# Patient Record
Sex: Female | Born: 1970 | Race: White | Hispanic: No | Marital: Married | Smoking: Never smoker
Health system: Southern US, Community
[De-identification: ages and names within clinical notes are randomized; demographics above are authoritative.]

---

## 2013-01-12 ENCOUNTER — Ambulatory Visit (INDEPENDENT_AMBULATORY_CARE_PROVIDER_SITE_OTHER): Payer: BC Managed Care – PPO | Admitting: Sports Medicine

## 2013-01-12 ENCOUNTER — Encounter: Payer: Self-pay | Admitting: Sports Medicine

## 2013-01-12 ENCOUNTER — Ambulatory Visit
Admission: RE | Admit: 2013-01-12 | Discharge: 2013-01-12 | Disposition: A | Discharge status: Home / Self Care | Payer: BC Managed Care – PPO | Source: Ambulatory Visit | Attending: Sports Medicine | Admitting: Sports Medicine

## 2013-01-12 VITALS — BP 135/85 | HR 99 | Ht 67.0 in | Wt 170.0 lb

## 2013-01-12 DIAGNOSIS — M533 Sacrococcygeal disorders, not elsewhere classified: Secondary | ICD-10-CM

## 2013-01-12 DIAGNOSIS — G8929 Other chronic pain: Secondary | ICD-10-CM

## 2013-01-12 DIAGNOSIS — M549 Dorsalgia, unspecified: Secondary | ICD-10-CM

## 2013-01-13 ENCOUNTER — Encounter: Payer: Self-pay | Admitting: *Deleted

## 2013-01-13 NOTE — Progress Notes (Signed)
  Subjective:    Patient ID: Sophia Cole, female    DOB: 15-Oct-1970, 42 y.o.   MRN: [id number]  HPI chief complaint: Low back pain  Very pleasant 42 year old female comes in today complaining of chronic low back pain. Her pain began after the delivery of her last child. She had a complicated hospitalization after she suffered from pyelonephritis during that delivery. In fact, she went into septic shock, was in the hospital a total of 8 days and experienced congestive heart failure as well. She was discharged home with physical therapy due to bilateral lower extremity weakness from her prolonged hospitalization. Since that time, she has experienced chronic intermittent right-sided low back pain. She has tried diligently to be reactive and in fact started doing some running but her pain has prevented her from being as active as she would like. She localizes all of her pain to the right SI joint. It is worse first thing in the morning as well as with activity. He has a very difficult time doing a sit up. She denies any groin pain. Pain at times will radiate across the low back but she denies any associated numbness or tingling into her legs. She's had several chiropractic adjustments and each adjustment does help but only temporarily. No recent trauma. No prior low back surgeries. No recent x-rays.  Medical history is as above Medications are reviewed She is allergic to Compazine Socially she does not smoke, she drinks on a rare occasion, and is a stay-at-home mom. She is a former ER nurse    Review of Systems     Objective:   Physical Exam Well-developed, well-nourished. No acute distress. Awake alert and oriented x3. Vital signs are reviewed.  Lumbar spine: She has pain with forward flexion as well as with extension. There is tenderness to palpation directly over the right SI joint but a negative Fabers. No tenderness over the greater trochanteric bursa and smooth painless hip range of motion.  Hip abductor strength is fairly good. She is unable to perform a simple set up do to pain. She has a negative straight leg raise bilaterally. Her strength is 5/5 in both lower extremities. Reflexes are equal at the Achilles and patellar tendons bilaterally. Sensation is intact to light touch grossly. No noticeable atrophy. Examination of her running form shows pronation, left greater than right with genu valgum.       Assessment & Plan:  1. 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 I will get AP and lateral views. I will call her with those results once available. If unremarkable, he will start physical therapy with Ellamae Sia. I've given her some green sports insoles with scaphoid pads to help correct her pronation and she will followup with me in 4 weeks. I explained to her that this will be a lengthy process to get her better. She ultimately will need aggressive core strengthening once her pain is under better control.  Total time spent face-to-face with the patient in instruction and consultation was 30 minutes.

## 2013-01-17 ENCOUNTER — Telehealth: Payer: Self-pay | Admitting: Sports Medicine

## 2013-01-17 NOTE — Telephone Encounter (Signed)
I spoke with the patient on the phone regarding x-rays of her lumbar spine. Minimal degenerative changes but overall rather unremarkable. She is scheduled to meet with Ellamae Sia soon. She is having some knee pain secondary to her orthotics. I recommended that she removed the scaphoid pads but continue with the green sports insoles. I will reevaluate this at her followup visit.

## 2013-02-13 ENCOUNTER — Encounter: Payer: Self-pay | Admitting: Sports Medicine

## 2013-02-13 ENCOUNTER — Ambulatory Visit (INDEPENDENT_AMBULATORY_CARE_PROVIDER_SITE_OTHER): Payer: BC Managed Care – PPO | Admitting: Sports Medicine

## 2013-02-13 VITALS — BP 124/86 | HR 64 | Ht 67.0 in | Wt 164.0 lb

## 2013-02-13 DIAGNOSIS — M549 Dorsalgia, unspecified: Secondary | ICD-10-CM

## 2013-02-13 NOTE — Progress Notes (Signed)
  Subjective:    Patient ID: Sophia Cole, female    DOB: 1970/09/27, 42 y.o.   MRN: [id number]  HPI Sophia Cole comes in today for followup on chronic low back pain. She has started working with Ellamae Sia and feels like she is about 15% improved. She is encouraged by her progress so far. After our last phone conversation she decided to keep her scaphoid pads in her shoes. She states they are comfortable. She is experiencing intermittent spasming of her back but continues to deny radiating pain into her legs or her feet. No associated numbness or tingling. X-rays of her lumbar spine showed some mild degenerative changes at L5-S1. Otherwise, unremarkable.    Review of Systems     Objective:   Physical Exam Well-developed, well-nourished. No acute distress. Awake alert and oriented x3  Lumbar spine: Good forward flexion and extension. No appreciable spasm. Negative straight leg raise bilaterally. No gross focal neurological deficits of either lower extremity.       Assessment & Plan:  1. Low back pain secondary to chronic lumbar strain with core weakness 2. Pronation  Patient will continue with physical therapy. Followup with me in 4 weeks at which point we will consider making her custom orthotics. If symptoms do not continue to improve with more physical therapy then we may need to consider merits of further diagnostic imaging. She apparently stumbled across some sort of "band procedure" on the Internet for chronic low back pain. I've asked her to provide me with more information and I'll be happy to research this for her but I explained to her that surgical options for low back pain should be approached cautiously.

## 2013-03-15 ENCOUNTER — Ambulatory Visit (INDEPENDENT_AMBULATORY_CARE_PROVIDER_SITE_OTHER): Payer: BC Managed Care – PPO | Admitting: Sports Medicine

## 2013-03-15 VITALS — BP 111/71 | Ht 67.0 in | Wt 164.0 lb

## 2013-03-15 DIAGNOSIS — M549 Dorsalgia, unspecified: Secondary | ICD-10-CM

## 2013-03-15 DIAGNOSIS — G8929 Other chronic pain: Secondary | ICD-10-CM

## 2013-03-15 NOTE — Progress Notes (Signed)
  Subjective:    Patient ID: Sophia Cole, female    DOB: [birth year], 42 y.o.   MRN: [id number]  HPI Sophia Cole comes in today for followup. She is making slow but steady progress in physical therapy. I have Sophia Cole's notes for review and he would like to continue to see her for a few more weeks. She would like to go ahead with custom orthotics as we have discussed previously. She remains active doing cross fit 3 times a week and would like to start doing some running.    Review of Systems     Objective:   Physical Exam Well-developed, well-nourished. No acute distress  Excellent lumbar range of motion. No spasm. No appreciable trigger points. No neurological deficits of either lower extremity grossly. Walking without a limp.       Assessment & Plan:  1. Low back pain secondary to chronic lumbar strain with core weakness 2. Pronation  Patient will continue working with Sophia Cole where she is making good progress. We will go ahead and make her some custom orthotics today and I will see her back in 4 weeks. No running yet.  Patient was fitted for a : standard, cushioned, semi-rigid orthotic. The orthotic was heated and afterward the patient stood on the orthotic blank positioned on the orthotic stand. The patient was positioned in subtalar neutral position and 10 degrees of ankle dorsiflexion in a weight bearing stance. After completion of molding, a stable base was applied to the orthotic blank. The blank was ground to a stable position for weight bearing. Size: 7 Base:blue EVB Posting: None Additional orthotic padding: none

## 2013-04-19 ENCOUNTER — Ambulatory Visit (INDEPENDENT_AMBULATORY_CARE_PROVIDER_SITE_OTHER): Payer: BC Managed Care – PPO | Admitting: Sports Medicine

## 2013-04-19 VITALS — BP 122/81 | Ht 67.0 in | Wt 162.0 lb

## 2013-04-19 DIAGNOSIS — M546 Pain in thoracic spine: Secondary | ICD-10-CM

## 2013-04-19 DIAGNOSIS — M533 Sacrococcygeal disorders, not elsewhere classified: Secondary | ICD-10-CM

## 2013-04-19 DIAGNOSIS — M545 Low back pain: Secondary | ICD-10-CM

## 2013-04-19 DIAGNOSIS — M549 Dorsalgia, unspecified: Secondary | ICD-10-CM

## 2013-04-19 NOTE — Progress Notes (Signed)
CC: Followup back pain HPI: Patient is a very pleasant 42 year old female who presents for followup of back pain. We previously been seeing her for low back pain and SI joint pain. She has been working with Sharen Hones in physical therapy and feels that her low back pain is now 85-90% better. She can definitely noticed that her pelvis is more steady. However she feels that her pain is now traveling up her back and she has more pain in the periscapular area on the right side. She occasionally gets neck pain and tension headaches as well. She says that she still has some weakness and this is causing her pain according to physical therapy. She is continuing to do cross it 3 times a week as well as occasional cardio classes and walking. She did try to run one time for a few seconds but started to feel some tightness in her low back and so stopped. She is wearing her orthotics that we made for her and thinks that they are comfortable and helpful.  ROS: As above in the HPI. All other systems are stable or negative.  OBJECTIVE: APPEARANCE:  Patient in no acute distress.The patient appeared well nourished and normally developed. HEENT: No scleral icterus. Conjunctiva non-injected Resp: Non labored Skin: No rash MSK:  Back exam: - Full range of motion in flexion, extension, lateral bending, rotation without pain  - No tenderness to palpation over the spinous processes of the lumbar vertebra - Tenderness to palpation at the right periscapular muscles and the trapezius and rhomboids - No tenderness to palpation at the SI joint or sciatic notch - Normal scapulohumeral rhythm. No scapular winging  ASSESSMENT: Biomechanical low back pain and periscapular pain, improving with conservative therapy   PLAN: Patient was encouraged to continue in physical therapy and doing her home exercises. She is making slow but steady progress which is great to see. We have encouraged her to continue her activities but  to avoid running at this point. We will see her back in 4 weeks for reevaluation.

## 2013-04-19 NOTE — Patient Instructions (Signed)
Thank you for coming in today  Continue your therapy with Sophia Cole We are glad you like your orthotics Continue to avoid running Consider massage therapy  We will see you again in 1 month

## 2013-05-22 ENCOUNTER — Ambulatory Visit (INDEPENDENT_AMBULATORY_CARE_PROVIDER_SITE_OTHER): Payer: BC Managed Care – PPO | Admitting: Sports Medicine

## 2013-05-22 VITALS — BP 128/80 | Ht 67.0 in | Wt 162.0 lb

## 2013-05-22 DIAGNOSIS — M549 Dorsalgia, unspecified: Secondary | ICD-10-CM

## 2013-05-22 NOTE — Assessment & Plan Note (Signed)
Assessment: patient with improving core strength, but still having hindering intermittent low back pain it is likely tightening of lumbar paraspinal muscles with possible contribution from tight hamstrings on the right greater than the left Plan:  - Discontinue physical therapy at this time for trial of home exercises - Patient given instructions for stretching will focus on hamstring flexibility - Stop planking - Run and walk as tolerated - Followup in one month

## 2013-05-22 NOTE — Progress Notes (Signed)
  Subjective:    Patient ID: Allanah Mcfarland, female    DOB: 1970/09/21, 42 y.o.   MRN: [id number]  HPI  42 year old female who presents for followup of low back pain that is considered to be biomechanical in origin secondary to core weakness. Her interventions for chronic low back pain have included physical therapy from  Ellamae Sia and the use of semirigid orthotics. Previous visit in August, she was encouraged to continue physical therapy and home exercises. She also instructed to avoid running. Since that time, the patient notes worsening pain since her last visit. She notes a "downward spiral" since starting planking exercises as instructed by her physical therapist. She specifies that the planking has caused her back to start "locking up." This is sudden low back pain when she changes position from lying to seated or standing. It last few a for second and then resolves. She has continued cross fit three times a week and does not have much locking up while doing cross it. She is not running. 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 She does not have any more PT appointment scheduled, which is causing her a lot of anxiety.    Review of Systems Denies weakness, bowel incontinence, urinary retention, and numbness of perineum     Objective:   Physical Exam BP 128/80  Ht 5\' 7"  (1.702 m)  Wt 162 lb (73.483 kg)  BMI 25.37 kg/m2  General: middle-aged white female, non-ill-appearing, pleasant and conversant  Back:  Appearance: sciolosis no Palpation: tenderness of paraspinal muscles yes, spinous process no; pelvis no  Flexion: normal Extension: normal FADIR: negative FABER: negative  Legs:  Length equal - 96 cm bilaterally Right hamstring with 10 less flexion than left  Neuro: Strength hip flexion 5/5, hip abduction 5/5, hip adduction 5/5,  knee extension 5/5, knee flexion 5/5, dorsiflexion 5/5, plantar  flexion 5/5 Straight Leg Raise: negative Sensation to light touch: intact Reflexes: 2+ Achilles and patellar bilaterally     Assessment & Plan:

## 2013-06-21 ENCOUNTER — Ambulatory Visit (INDEPENDENT_AMBULATORY_CARE_PROVIDER_SITE_OTHER): Payer: BC Managed Care – PPO | Admitting: Sports Medicine

## 2013-06-21 VITALS — BP 110/70 | Ht 67.0 in | Wt 162.0 lb

## 2013-06-21 DIAGNOSIS — M549 Dorsalgia, unspecified: Secondary | ICD-10-CM

## 2013-06-21 NOTE — Progress Notes (Signed)
  Subjective:    Patient ID: Sophia Cole, female    DOB: 01/20/1971, 42 y.o.   MRN: [id number]  HPI Sophia Cole comes in today for followup on her low back pain. Overall, she is feeling pretty good. She has avoided planking and so she has not had any more episodes of her back "locking up". She has been discharged from physical therapy and has an excellent understanding of her home exercise program. She has been working on hamstring flexibility. She is without complaint today.    Review of Systems     Objective:   Physical Exam Well-developed, well-nourished. No acute distress. Awake alert and oriented x3  Lumbar spine: Full painless lumbar mobility. She has fairly good flexibility with hamstring stretching today. No tenderness to palpation along the paraspinal musculature. Good hip abductor strength. Good hip flexor strength. Neurovascularly intact distally.       Assessment & Plan:  Improved low back pain secondary to core weakness  It has taken several months for the patient to progress to the point that she is at now. She seems to be satisfied with her progress to date. She understands that there are some activities that she will be able to do and others that she should avoid. She understands the importance of a lifelong core strengthening program. She may from time to time need a return visit to physical therapy for a "tuneup" but for now she is doing excellent. I think at this point in time we can keep things open-ended for her to followup as needed.

## 2014-07-14 IMAGING — CR DG LUMBAR SPINE 2-3V
3 series · 3 of 3 positions shown · non-contrast
Comparison: None.

CLINICAL DATA: Low back pain for 5 years

LUMBAR SPINE - 2-3 VIEW

[t l-spine a.p.]
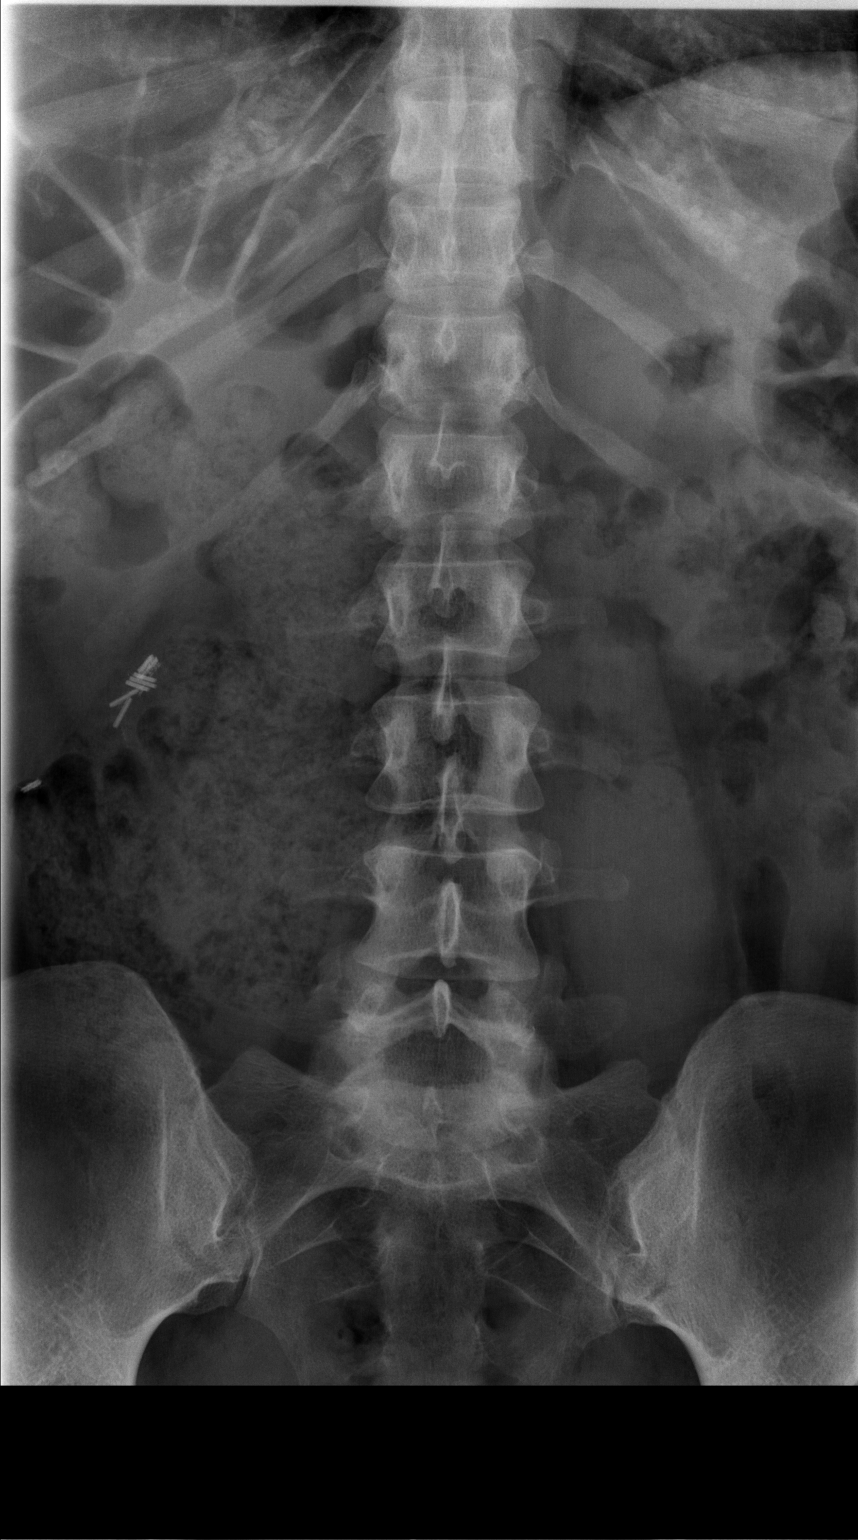

[t l-spine lat]
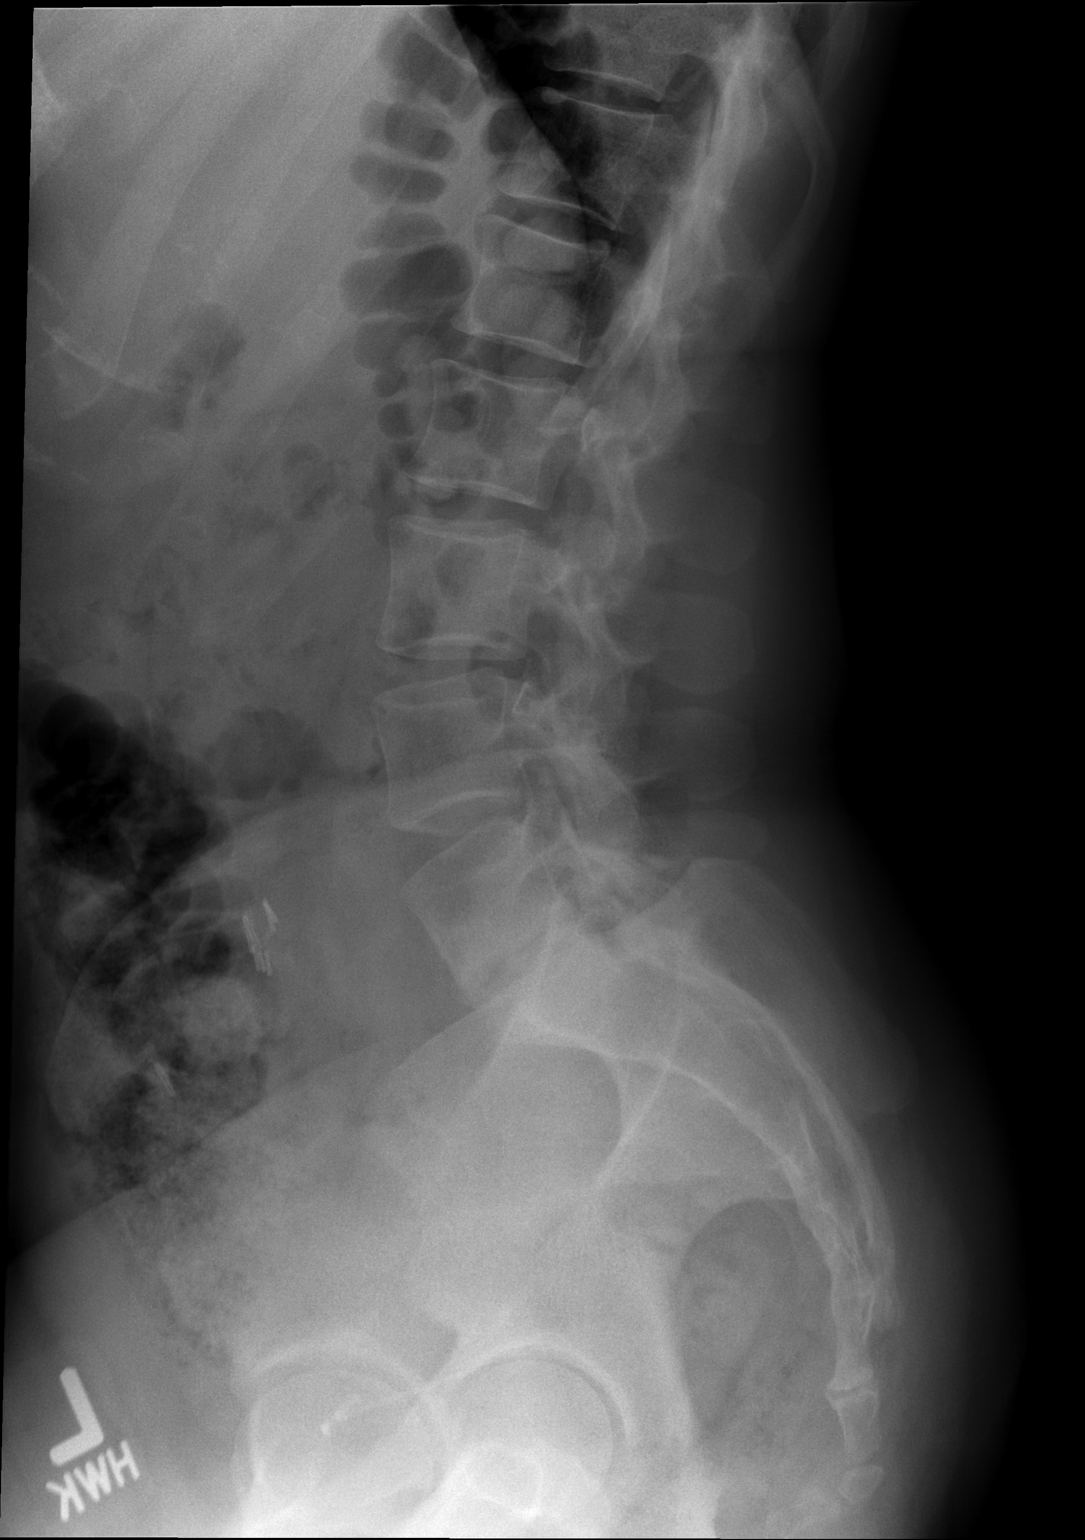

[t l-spine l5-s1 spot]
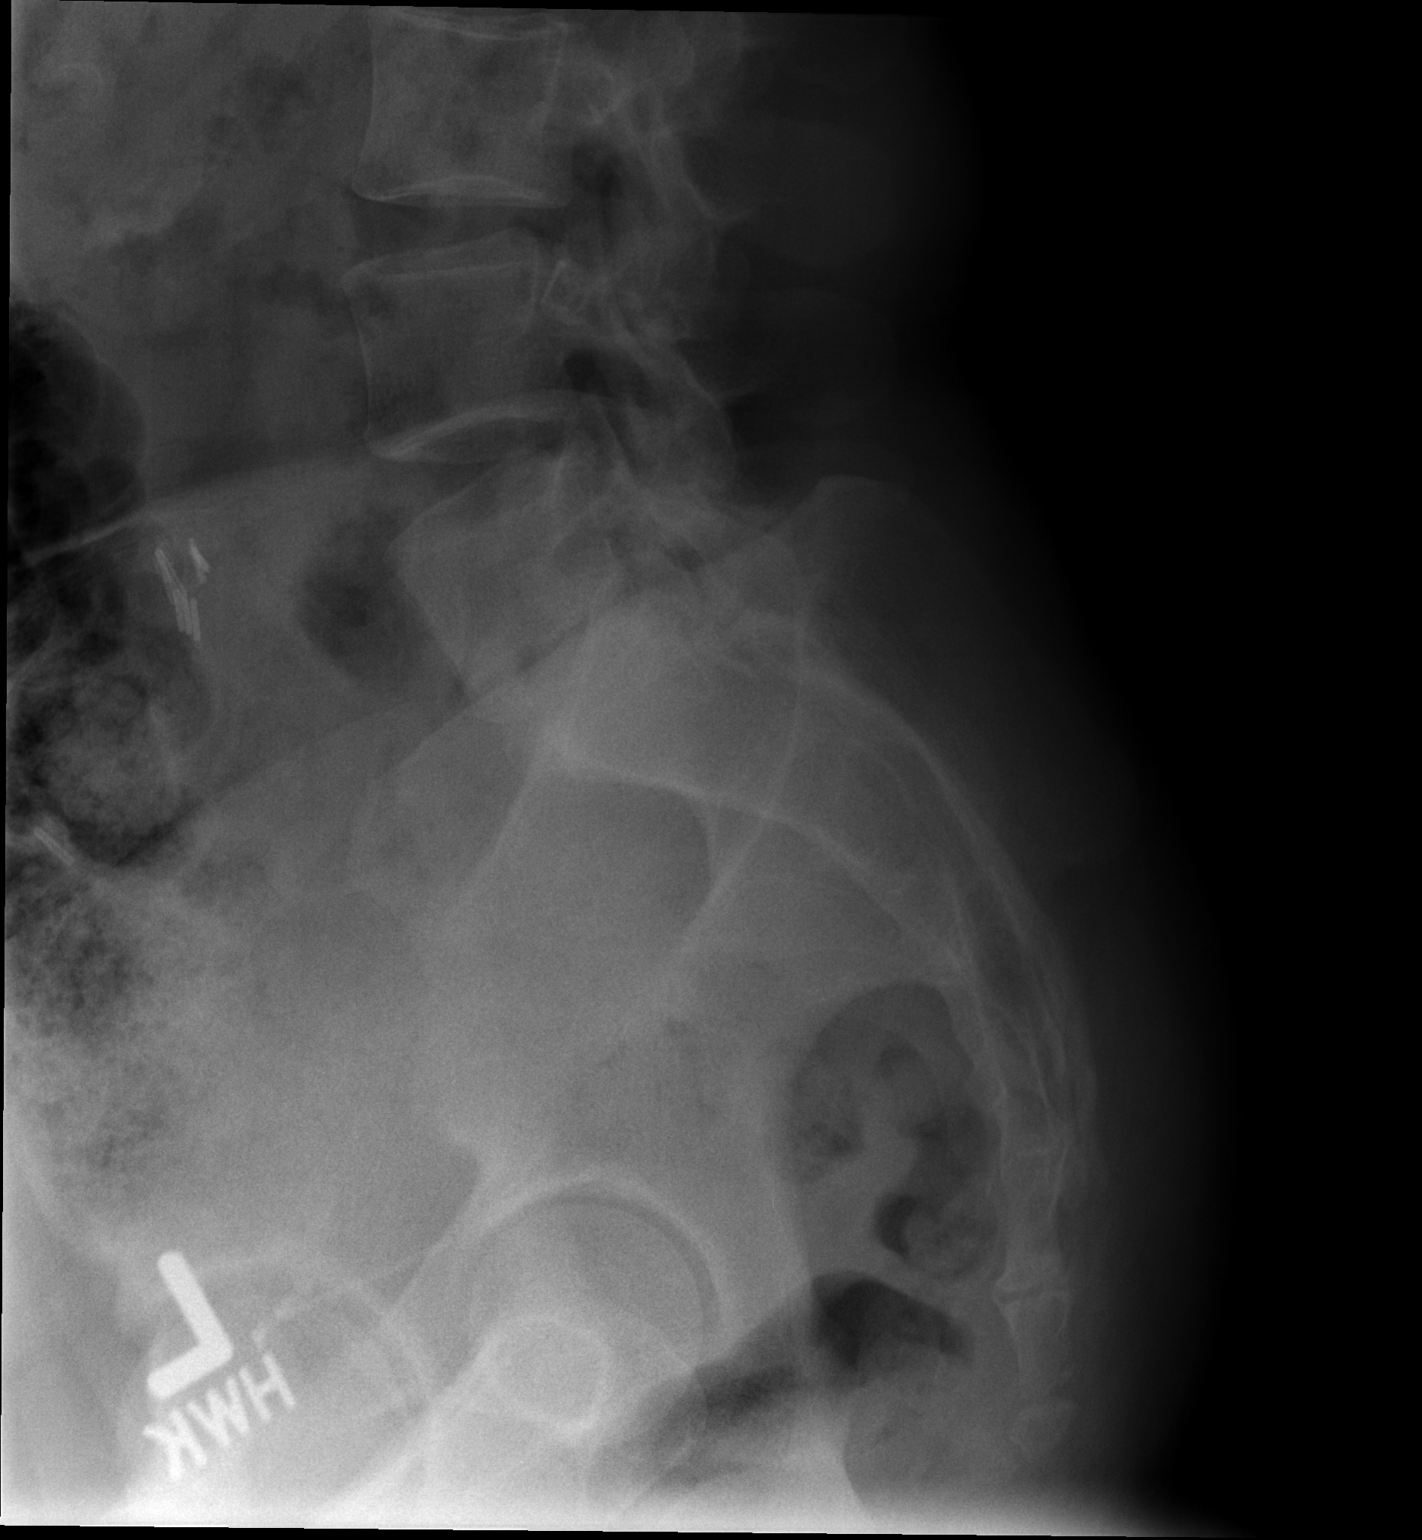

[3 of 3 positions shown; findings below may reference images not displayed]

FINDINGS: The lumbar vertebrae are in normal alignment.  Only mild
degenerative disc disease is present at L5-S1 with some loss of
disc space and mild sclerosis.  No compression deformity is seen.
The bowel gas pattern is nonspecific.  Surgical clips are present
in the right upper quadrant from prior cholecystectomy.
IMPRESSION: Normal alignment.  Mild degenerative disc disease at L5-S1.

## 2020-04-30 ENCOUNTER — Other Ambulatory Visit: Payer: Self-pay

## 2020-04-30 ENCOUNTER — Encounter: Payer: Self-pay | Admitting: Sports Medicine

## 2020-04-30 ENCOUNTER — Ambulatory Visit (INDEPENDENT_AMBULATORY_CARE_PROVIDER_SITE_OTHER): Payer: BC Managed Care – PPO | Admitting: Sports Medicine

## 2020-04-30 VITALS — BP 124/70 | Ht 67.0 in | Wt 187.6 lb

## 2020-04-30 DIAGNOSIS — M722 Plantar fascial fibromatosis: Secondary | ICD-10-CM | POA: Diagnosis not present

## 2020-04-30 NOTE — Progress Notes (Signed)
   Subjective:    Patient ID: Sophia Cole, female    DOB: July 30, 1971, 49 y.o.   MRN: [id number]  HPI chief complaint: Left heel pain  Sophia Cole comes in today complaining of several weeks of left heel pain.  Pain is along the plantar aspect of her heel.  Worse with first step.  She has tried an arch strap and some stretching at home.  She has had custom orthotics in the past with good results.  Those orthotics are quite old.  She has purchased a new pair of tennis shoes.  She is here today for advice about treating her plantar fasciitis.  Past medical history reviewed Medications reviewed Allergies reviewed    Review of Systems    As above Objective:   Physical Exam  Well-developed, well-nourished.  No acute distress  Examination of both feet shows a rigid cavus foot.  She is tender to palpation at the calcaneal origin of the plantar fascia the left.  Negative calcaneal squeeze.  No soft tissue swelling.  Good pulses.      Assessment & Plan:   Plantar fasciitis, left foot Cavus foot  I think the patient would benefit from new custom orthotics.  She will schedule that at her earliest convenience.  In the meantime, we will educate her in plantar fascial stretching and she will purchase a [place] sock to wear at night.

## 2020-05-07 ENCOUNTER — Other Ambulatory Visit: Payer: Self-pay

## 2020-05-07 ENCOUNTER — Encounter: Payer: Self-pay | Admitting: Sports Medicine

## 2020-05-07 ENCOUNTER — Ambulatory Visit (INDEPENDENT_AMBULATORY_CARE_PROVIDER_SITE_OTHER): Payer: BC Managed Care – PPO | Admitting: Sports Medicine

## 2020-05-07 VITALS — BP 128/80 | Ht 67.0 in | Wt 186.0 lb

## 2020-05-07 DIAGNOSIS — M722 Plantar fascial fibromatosis: Secondary | ICD-10-CM | POA: Diagnosis not present

## 2020-05-07 NOTE — Progress Notes (Signed)
Patient ID: Sophia Cole, female   DOB: 1970/12/27, 49 y.o.   MRN: [id number]  Leasia comes in today for custom orthotics.  Please see the office note from April 30, 2020 for details regarding history and physical exam findings.  In short, she is battling left heel plantar fasciitis.  It is slowly improving.  I recommended new custom orthotics for the additional cushioning that they will provide.  Custom orthotics were created for her today.  She found them to be comfortable prior to leaving the office.  Gait was neutral with orthotics in place.  Follow-up as needed.  Patient was fitted for a : standard, cushioned, semi-rigid orthotic. The orthotic was heated and afterward the patient stood on the orthotic blank positioned on the orthotic stand. The patient was positioned in subtalar neutral position and 10 degrees of ankle dorsiflexion in a weight bearing stance. After completion of molding, a stable base was applied to the orthotic blank. The blank was ground to a stable position for weight bearing. Size: 8 Base: Blue EVA Posting: none Additional orthotic padding: none
